# Patient Record
Sex: Female | Born: 1950 | Race: White | Hispanic: No | Marital: Married | Smoking: Never smoker
Health system: Southern US, Community
[De-identification: ages and names within clinical notes are randomized; demographics above are authoritative.]

## PROBLEM LIST (undated history)

## (undated) DIAGNOSIS — M8580 Other specified disorders of bone density and structure, unspecified site: Secondary | ICD-10-CM

## (undated) DIAGNOSIS — E7800 Pure hypercholesterolemia, unspecified: Secondary | ICD-10-CM

---

## 1999-02-23 ENCOUNTER — Other Ambulatory Visit: Admission: RE | Admit: 1999-02-23 | Discharge: 1999-02-23 | Payer: Self-pay | Admitting: Family Medicine

## 2000-05-05 ENCOUNTER — Encounter: Admission: RE | Admit: 2000-05-05 | Discharge: 2000-05-05 | Payer: Self-pay | Admitting: Family Medicine

## 2000-05-05 ENCOUNTER — Encounter: Payer: Self-pay | Admitting: Family Medicine

## 2000-05-09 ENCOUNTER — Encounter: Admission: RE | Admit: 2000-05-09 | Discharge: 2000-05-09 | Payer: Self-pay | Admitting: Family Medicine

## 2000-05-09 ENCOUNTER — Encounter: Payer: Self-pay | Admitting: Family Medicine

## 2000-12-19 ENCOUNTER — Encounter: Admission: RE | Admit: 2000-12-19 | Discharge: 2000-12-19 | Payer: Self-pay | Admitting: Family Medicine

## 2000-12-19 ENCOUNTER — Encounter: Payer: Self-pay | Admitting: Family Medicine

## 2001-06-27 ENCOUNTER — Other Ambulatory Visit: Admission: RE | Admit: 2001-06-27 | Discharge: 2001-06-27 | Payer: Self-pay | Admitting: Family Medicine

## 2003-10-23 ENCOUNTER — Encounter: Admission: RE | Admit: 2003-10-23 | Discharge: 2003-10-23 | Payer: Self-pay | Admitting: Family Medicine

## 2004-01-23 ENCOUNTER — Other Ambulatory Visit: Admission: RE | Admit: 2004-01-23 | Discharge: 2004-01-23 | Payer: Self-pay | Admitting: Family Medicine

## 2004-04-19 ENCOUNTER — Ambulatory Visit (HOSPITAL_COMMUNITY): Admission: RE | Admit: 2004-04-19 | Discharge: 2004-04-19 | Payer: Self-pay | Admitting: Gastroenterology

## 2004-04-19 ENCOUNTER — Encounter (INDEPENDENT_AMBULATORY_CARE_PROVIDER_SITE_OTHER): Payer: Self-pay | Admitting: Specialist

## 2005-06-20 HISTORY — PX: REDUCTION MAMMAPLASTY: SUR839

## 2005-08-10 ENCOUNTER — Encounter: Admission: RE | Admit: 2005-08-10 | Discharge: 2005-08-10 | Payer: Self-pay | Admitting: Family Medicine

## 2005-09-26 ENCOUNTER — Other Ambulatory Visit: Admission: RE | Admit: 2005-09-26 | Discharge: 2005-09-26 | Payer: Self-pay | Admitting: Family Medicine

## 2006-09-13 ENCOUNTER — Encounter: Admission: RE | Admit: 2006-09-13 | Discharge: 2006-09-13 | Payer: Self-pay | Admitting: Family Medicine

## 2006-09-27 ENCOUNTER — Other Ambulatory Visit: Admission: RE | Admit: 2006-09-27 | Discharge: 2006-09-27 | Payer: Self-pay | Admitting: Family Medicine

## 2007-10-15 ENCOUNTER — Other Ambulatory Visit: Admission: RE | Admit: 2007-10-15 | Discharge: 2007-10-15 | Payer: Self-pay | Admitting: Family Medicine

## 2008-01-02 ENCOUNTER — Encounter: Admission: RE | Admit: 2008-01-02 | Discharge: 2008-01-02 | Payer: Self-pay | Admitting: Family Medicine

## 2009-01-15 ENCOUNTER — Encounter: Admission: RE | Admit: 2009-01-15 | Discharge: 2009-01-15 | Payer: Self-pay | Admitting: Family Medicine

## 2009-03-24 ENCOUNTER — Other Ambulatory Visit: Admission: RE | Admit: 2009-03-24 | Discharge: 2009-03-24 | Payer: Self-pay | Admitting: Family Medicine

## 2010-02-16 ENCOUNTER — Encounter: Admission: RE | Admit: 2010-02-16 | Discharge: 2010-02-16 | Payer: Self-pay | Admitting: Family Medicine

## 2010-07-11 ENCOUNTER — Encounter: Payer: Self-pay | Admitting: Family Medicine

## 2010-11-05 NOTE — Op Note (Signed)
NAMEBURMA, KETCHER NO.:  [id number]   MEDICAL RECORD NO.:  [id number]          PATIENT TYPE:  AMB   LOCATION:  ENDO                         FACILITY:  [facility]   PHYSICIAN:  Danise Edge, M.D.   DATE OF BIRTH:  12-Apr-1951   DATE OF PROCEDURE:  04/19/2004  DATE OF DISCHARGE:                                 OPERATIVE REPORT   PROCEDURE:  Screening colonoscopy.   INDICATIONS FOR PROCEDURE:  Ms. Telisa Ohlsen is a 60 year old female born  1950-08-02.  Ms. Diana is scheduled to undergo her first screening  colonoscopy with polypectomy to prevent colon cancer.   ENDOSCOPIST:  Danise Edge, M.D.   PREMEDICATION:  Versed 8 mg, Demerol 50 mg.   DESCRIPTION OF PROCEDURE:  After obtaining informed consent, Ms. Fleishman was  placed in the left lateral decubitus position.  I administered intravenous  Demerol and intravenous Versed to achieve conscious sedation for the  procedure.  The patient's blood pressure, oxygen saturation, and cardiac  rhythm were monitored throughout the procedure and documented in the medical  record.   Anal inspection and digital rectal exam were normal.  The Olympus adjustable  pediatric colonoscope was introduced into the rectum and advanced to the  cecum.  The colonic preparation for the exam today was excellent.   Rectum:  Normal.   Sigmoid colon and descending colon:  At 40 cm from the anal verge, a 3-mm  sessile polyp was removed with electrocautery snare.   Splenic flexure:  Normal.   Transverse colon:  Normal.   Hepatic flexure:  Normal.   Ascending colon:  Normal.   Cecum and ileocecal valve:  In the proximal cecum adjacent to the  appendiceal orifice, a 2-mm sessile polyp was removed with the  electrocautery snare.  From the mid-cecum, a diminutive 1-mm sessile polyp  was removed with the electrocautery snare.   ASSESSMENT:  Two small polyps were removed from the cecum, and a small polyp  was removed from the distal  sigmoid colon.      MJ/MEDQ  D:  04/19/2004  T:  04/19/2004  Job:  [id number]   cc:   Donia Guiles, M.D.  [address] [place]  [place] [address]  Fax: [phone]

## 2011-04-13 ENCOUNTER — Other Ambulatory Visit: Payer: Self-pay | Admitting: Family Medicine

## 2011-04-13 DIAGNOSIS — Z1231 Encounter for screening mammogram for malignant neoplasm of breast: Secondary | ICD-10-CM

## 2011-05-04 ENCOUNTER — Ambulatory Visit
Admission: RE | Admit: 2011-05-04 | Discharge: 2011-05-04 | Disposition: A | Discharge status: Home / Self Care | Payer: BC Managed Care – PPO | Source: Ambulatory Visit | Attending: Family Medicine | Admitting: Family Medicine

## 2011-05-04 DIAGNOSIS — Z1231 Encounter for screening mammogram for malignant neoplasm of breast: Secondary | ICD-10-CM

## 2012-05-31 ENCOUNTER — Other Ambulatory Visit: Payer: Self-pay | Admitting: Advanced Practice Midwife

## 2012-05-31 DIAGNOSIS — Z9889 Other specified postprocedural states: Secondary | ICD-10-CM

## 2012-05-31 DIAGNOSIS — Z1231 Encounter for screening mammogram for malignant neoplasm of breast: Secondary | ICD-10-CM

## 2012-06-05 ENCOUNTER — Ambulatory Visit
Admission: RE | Admit: 2012-06-05 | Discharge: 2012-06-05 | Disposition: A | Discharge status: Home / Self Care | Payer: BC Managed Care – PPO | Source: Ambulatory Visit | Attending: Advanced Practice Midwife | Admitting: Advanced Practice Midwife

## 2012-06-05 DIAGNOSIS — Z9889 Other specified postprocedural states: Secondary | ICD-10-CM

## 2012-06-05 DIAGNOSIS — Z1231 Encounter for screening mammogram for malignant neoplasm of breast: Secondary | ICD-10-CM

## 2012-07-20 ENCOUNTER — Other Ambulatory Visit: Payer: Self-pay | Admitting: Family Medicine

## 2012-07-20 ENCOUNTER — Other Ambulatory Visit (HOSPITAL_COMMUNITY)
Admission: RE | Admit: 2012-07-20 | Discharge: 2012-07-20 | Disposition: A | Discharge status: Home / Self Care | Payer: BC Managed Care – PPO | Source: Ambulatory Visit | Attending: Family Medicine | Admitting: Family Medicine

## 2012-07-20 DIAGNOSIS — Z1151 Encounter for screening for human papillomavirus (HPV): Secondary | ICD-10-CM | POA: Insufficient documentation

## 2012-07-20 DIAGNOSIS — Z124 Encounter for screening for malignant neoplasm of cervix: Secondary | ICD-10-CM | POA: Insufficient documentation

## 2013-07-18 ENCOUNTER — Other Ambulatory Visit: Payer: Self-pay

## 2013-07-18 DIAGNOSIS — Z1231 Encounter for screening mammogram for malignant neoplasm of breast: Secondary | ICD-10-CM

## 2013-08-08 ENCOUNTER — Ambulatory Visit
Admission: RE | Admit: 2013-08-08 | Discharge: 2013-08-08 | Disposition: A | Discharge status: Home / Self Care | Payer: BC Managed Care – PPO | Source: Ambulatory Visit

## 2013-08-08 DIAGNOSIS — Z1231 Encounter for screening mammogram for malignant neoplasm of breast: Secondary | ICD-10-CM

## 2014-07-14 ENCOUNTER — Other Ambulatory Visit: Payer: Self-pay

## 2014-07-14 DIAGNOSIS — Z1231 Encounter for screening mammogram for malignant neoplasm of breast: Secondary | ICD-10-CM

## 2014-08-11 ENCOUNTER — Ambulatory Visit
Admission: RE | Admit: 2014-08-11 | Discharge: 2014-08-11 | Disposition: A | Discharge status: Home / Self Care | Payer: BLUE CROSS/BLUE SHIELD | Source: Ambulatory Visit

## 2014-08-11 DIAGNOSIS — Z1231 Encounter for screening mammogram for malignant neoplasm of breast: Secondary | ICD-10-CM

## 2014-10-02 ENCOUNTER — Other Ambulatory Visit: Payer: Self-pay | Admitting: Gastroenterology

## 2014-12-19 ENCOUNTER — Other Ambulatory Visit: Payer: Self-pay | Admitting: Gastroenterology

## 2015-01-02 ENCOUNTER — Encounter (HOSPITAL_COMMUNITY): Payer: Self-pay | Admitting: *Deleted

## 2015-01-05 ENCOUNTER — Other Ambulatory Visit: Payer: Self-pay | Admitting: Gastroenterology

## 2015-01-06 ENCOUNTER — Ambulatory Visit (HOSPITAL_COMMUNITY): Payer: BLUE CROSS/BLUE SHIELD | Admitting: Anesthesiology

## 2015-01-06 ENCOUNTER — Ambulatory Visit (HOSPITAL_COMMUNITY)
Admission: RE | Admit: 2015-01-06 | Discharge: 2015-01-06 | Disposition: A | Discharge status: Home / Self Care | Payer: BLUE CROSS/BLUE SHIELD | Source: Ambulatory Visit | Attending: Gastroenterology | Admitting: Gastroenterology

## 2015-01-06 ENCOUNTER — Encounter (HOSPITAL_COMMUNITY)
Admission: RE | Disposition: A | Discharge status: Home / Self Care | Payer: Self-pay | Source: Ambulatory Visit | Attending: Gastroenterology

## 2015-01-06 ENCOUNTER — Encounter (HOSPITAL_COMMUNITY): Payer: Self-pay | Admitting: *Deleted

## 2015-01-06 DIAGNOSIS — D120 Benign neoplasm of cecum: Secondary | ICD-10-CM | POA: Insufficient documentation

## 2015-01-06 DIAGNOSIS — Z86010 Personal history of colonic polyps: Secondary | ICD-10-CM | POA: Insufficient documentation

## 2015-01-06 DIAGNOSIS — D122 Benign neoplasm of ascending colon: Secondary | ICD-10-CM | POA: Insufficient documentation

## 2015-01-06 DIAGNOSIS — Z09 Encounter for follow-up examination after completed treatment for conditions other than malignant neoplasm: Secondary | ICD-10-CM | POA: Diagnosis present

## 2015-01-06 HISTORY — DX: Pure hypercholesterolemia, unspecified: E78.00

## 2015-01-06 HISTORY — PX: COLONOSCOPY WITH PROPOFOL: SHX5780

## 2015-01-06 HISTORY — DX: Other specified disorders of bone density and structure, unspecified site: M85.80

## 2015-01-06 SURGERY — COLONOSCOPY WITH PROPOFOL
Anesthesia: Monitor Anesthesia Care

## 2015-01-06 MED ORDER — PROPOFOL INFUSION 10 MG/ML OPTIME
INTRAVENOUS | Status: DC | PRN
  Administered 2015-01-06: 200 ug/kg/min via INTRAVENOUS

## 2015-01-06 MED ORDER — PROPOFOL 10 MG/ML IV BOLUS
INTRAVENOUS | Status: AC
  Filled 2015-01-06: qty 20

## 2015-01-06 MED ORDER — LACTATED RINGERS IV SOLN
INTRAVENOUS | Status: DC
  Administered 2015-01-06: 08:00:00 via INTRAVENOUS
  Administered 2015-01-06: 1000 mL via INTRAVENOUS

## 2015-01-06 MED ORDER — SODIUM CHLORIDE 0.9 % IV SOLN: INTRAVENOUS | Status: DC

## 2015-01-06 SURGICAL SUPPLY — 21 items

## 2015-01-06 NOTE — H&P (Signed)
  Procedure: Surveillance colonoscopy. 04/19/2004 colonoscopy performed with removal of a small tubulovillous adenomatous sigmoid colon polyp  History: The patient is a 64 year old female born 1951/04/09. She is scheduled to undergo a surveillance colonoscopy today.  Past medical history: Right lung surgery for dormant tuberculosis. Breast reduction surgery. Hypercholesterolemia.  Allergies: Bee sting causes anaphylaxis  Exam: The patient is alert and lying comfortably on the endoscopy stretcher. Abdomen is soft and nontender to palpation. Lungs are clear to auscultation. Cardiac exam reveals a regular rhythm.  Plan: Proceed with surveillance colonoscopy

## 2015-01-06 NOTE — Anesthesia Preprocedure Evaluation (Signed)
Anesthesia Evaluation  Patient identified by MRN, date of birth, ID band Patient awake    Reviewed: Allergy & Precautions, NPO status , Patient's Chart, lab work & pertinent test results  Airway Mallampati: I  TM Distance: >3 FB     Dental  (+) Teeth Intact   Pulmonary neg pulmonary ROS,  breath sounds clear to auscultation        Cardiovascular negative cardio ROS  Rhythm:Regular Rate:Normal     Neuro/Psych negative neurological ROS     GI/Hepatic negative GI ROS, Neg liver ROS,   Endo/Other  negative endocrine ROS  Renal/GU negative Renal ROS     Musculoskeletal negative musculoskeletal ROS (+)   Abdominal   Peds  Hematology negative hematology ROS (+)   Anesthesia Other Findings   Reproductive/Obstetrics                             Anesthesia Physical Anesthesia Plan  ASA: I  Anesthesia Plan: MAC   Post-op Pain Management:    Induction: Intravenous  Airway Management Planned: Natural Airway and Nasal Cannula  Additional Equipment:   Intra-op Plan:   Post-operative Plan:   Informed Consent: I have reviewed the patients History and Physical, chart, labs and discussed the procedure including the risks, benefits and alternatives for the proposed anesthesia with the patient or authorized representative who has indicated his/her understanding and acceptance.   Dental advisory given  Plan Discussed with: CRNA and Surgeon  Anesthesia Plan Comments:         Anesthesia Quick Evaluation

## 2015-01-06 NOTE — Discharge Instructions (Signed)
Colonoscopy, Care After °These instructions give you information on caring for yourself after your procedure. Your doctor may also give you more specific instructions. Call your doctor if you have any problems or questions after your procedure. °HOME CARE °· Do not drive for 24 hours. °· Do not sign important papers or use machinery for 24 hours. °· You may shower. °· You may go back to your usual activities, but go slower for the first 24 hours. °· Take rest breaks often during the first 24 hours. °· Walk around or use warm packs on your belly (abdomen) if you have belly cramping or gas. °· Drink enough fluids to keep your pee (urine) clear or pale yellow. °· Resume your normal diet. Avoid heavy or fried foods. °· Avoid drinking alcohol for 24 hours or as told by your doctor. °· Only take medicines as told by your doctor. °If a tissue sample (biopsy) was taken during the procedure:  °· Do not take aspirin or blood thinners for 7 days, or as told by your doctor. °· Do not drink alcohol for 7 days, or as told by your doctor. °· Eat soft foods for the first 24 hours. °GET HELP IF: °You still have a small amount of blood in your poop (stool) 2-3 days after the procedure. °GET HELP RIGHT AWAY IF: °· You have more than a small amount of blood in your poop. °· You see clumps of tissue (blood clots) in your poop. °· Your belly is puffy (swollen). °· You feel sick to your stomach (nauseous) or throw up (vomit). °· You have a fever. °· You have belly pain that gets worse and medicine does not help. °MAKE SURE YOU: °· Understand these instructions. °· Will watch your condition. °· Will get help right away if you are not doing well or get worse. °Document Released: 07/09/2010 Document Revised: 06/11/2013 Document Reviewed: 02/11/2013 °[employer]® Patient Information ©2015 [employer]. This information is not intended to replace advice given to you by your health care provider. Make sure you discuss any questions you have with  your health care provider. ° °

## 2015-01-06 NOTE — Op Note (Signed)
Procedure: Surveillance colonoscopy. 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 Anal inspection and digital rectal exam were normal. The Pentax pediatric colonoscope was introduced into the rectum and advanced to the cecum. A normal-appearing appendiceal orifice and ileocecal valve were identified. Colonic preparation for the exam today was good. Withdrawal time was 10 minutes  Rectum. Normal. Retroflex view of the distal rectum was normal  Sigmoid colon and descending colon. Normal  Splenic flexure. Normal  Transverse colon. Normal  Hepatic flexure. Normal  Ascending colon. From the proximal ascending colon, a 7 mm sessile serrated appearing polyp was removed in piecemeal fashion with the cold snare and cold biopsy forceps.  Cecum and ileocecal valve. A 5 mm sessile polyp was removed from the mid cecum with the cold snare.  Assessment: From the proximal ascending colon, a 7 mm sessile serrated appearing polyp was removed with the cold snare and from the cecum, a 5 mm sessile polyp was removed with the cold snare.  Recommendation: I will review the polyp pathology to determine when a repeat colonoscopy should be performed

## 2015-01-06 NOTE — Anesthesia Postprocedure Evaluation (Signed)
  Anesthesia Post-op Note  Patient: Jean Andrews  Procedure(s) Performed: Procedure(s): COLONOSCOPY WITH PROPOFOL (N/A)  Patient Location: Endoscopy Unit  Anesthesia Type:MAC  Level of Consciousness: awake and alert   Airway and Oxygen Therapy: Patient Spontanous Breathing  Post-op Pain: none  Post-op Assessment: Post-op Vital signs reviewed              Post-op Vital Signs: stable  Last Vitals:  Filed Vitals:   01/06/15 0950  BP:   Pulse: 52  Temp:   Resp:     Complications: No apparent anesthesia complications

## 2015-01-06 NOTE — Transfer of Care (Signed)
Immediate Anesthesia Transfer of Care Note  Patient: Jean Andrews  Procedure(s) Performed: Procedure(s): COLONOSCOPY WITH PROPOFOL (N/A)  Patient Location: PACU  Anesthesia Type:MAC  Level of Consciousness:  sedated, patient cooperative and responds to stimulation  Airway & Oxygen Therapy:Patient Spontanous Breathing and Patient connected to face mask oxgen  Post-op Assessment:  Report given to PACU RN and Post -op Vital signs reviewed and stable  Post vital signs:  Reviewed and stable  Last Vitals:  Filed Vitals:   01/06/15 0802  BP: 128/74  Temp:   Resp: 10    Complications: No apparent anesthesia complications

## 2015-01-06 NOTE — Progress Notes (Signed)
Patient reports that her pulse is usually lower after she has procedures. Pulse 47-52. Patient is asymptomatic and BP and all other vita signs are normal. Patient is not dizzy, lightheaded or in any pain. Advised patient to rest today and rehydrate. Patient verbalized understanding.

## 2015-01-08 ENCOUNTER — Encounter (HOSPITAL_COMMUNITY): Payer: Self-pay | Admitting: Gastroenterology

## 2015-08-24 ENCOUNTER — Other Ambulatory Visit: Payer: Self-pay

## 2015-08-24 DIAGNOSIS — Z1231 Encounter for screening mammogram for malignant neoplasm of breast: Secondary | ICD-10-CM

## 2015-08-24 DIAGNOSIS — Z9889 Other specified postprocedural states: Secondary | ICD-10-CM

## 2015-09-07 ENCOUNTER — Ambulatory Visit: Payer: BLUE CROSS/BLUE SHIELD

## 2015-09-09 ENCOUNTER — Ambulatory Visit
Admission: RE | Admit: 2015-09-09 | Discharge: 2015-09-09 | Disposition: A | Discharge status: Home / Self Care | Payer: BLUE CROSS/BLUE SHIELD | Source: Ambulatory Visit

## 2015-09-09 DIAGNOSIS — Z9889 Other specified postprocedural states: Secondary | ICD-10-CM

## 2015-09-09 DIAGNOSIS — Z1231 Encounter for screening mammogram for malignant neoplasm of breast: Secondary | ICD-10-CM

## 2016-10-26 ENCOUNTER — Other Ambulatory Visit: Payer: Self-pay | Admitting: Family Medicine

## 2016-10-26 DIAGNOSIS — Z1231 Encounter for screening mammogram for malignant neoplasm of breast: Secondary | ICD-10-CM

## 2016-11-15 ENCOUNTER — Ambulatory Visit: Payer: BLUE CROSS/BLUE SHIELD

## 2016-11-24 ENCOUNTER — Ambulatory Visit
Admission: RE | Admit: 2016-11-24 | Discharge: 2016-11-24 | Disposition: A | Discharge status: Home / Self Care | Payer: BLUE CROSS/BLUE SHIELD | Source: Ambulatory Visit | Attending: Family Medicine | Admitting: Family Medicine

## 2016-11-24 DIAGNOSIS — Z1231 Encounter for screening mammogram for malignant neoplasm of breast: Secondary | ICD-10-CM

## 2018-01-15 DIAGNOSIS — H04123 Dry eye syndrome of bilateral lacrimal glands: Secondary | ICD-10-CM | POA: Diagnosis not present

## 2018-01-31 DIAGNOSIS — M8588 Other specified disorders of bone density and structure, other site: Secondary | ICD-10-CM | POA: Diagnosis not present

## 2019-05-29 ENCOUNTER — Other Ambulatory Visit (HOSPITAL_COMMUNITY)
Admission: RE | Admit: 2019-05-29 | Discharge: 2019-05-29 | Disposition: A | Discharge status: Home / Self Care | Payer: PPO | Source: Ambulatory Visit | Attending: Family Medicine | Admitting: Family Medicine

## 2019-05-29 ENCOUNTER — Other Ambulatory Visit: Payer: Self-pay | Admitting: Family Medicine

## 2019-05-29 DIAGNOSIS — R636 Underweight: Secondary | ICD-10-CM | POA: Diagnosis not present

## 2019-05-29 DIAGNOSIS — Z1151 Encounter for screening for human papillomavirus (HPV): Secondary | ICD-10-CM | POA: Diagnosis not present

## 2019-05-29 DIAGNOSIS — M8580 Other specified disorders of bone density and structure, unspecified site: Secondary | ICD-10-CM | POA: Diagnosis not present

## 2019-05-29 DIAGNOSIS — Z124 Encounter for screening for malignant neoplasm of cervix: Secondary | ICD-10-CM | POA: Diagnosis not present

## 2019-05-29 DIAGNOSIS — Z23 Encounter for immunization: Secondary | ICD-10-CM | POA: Diagnosis not present

## 2019-05-29 DIAGNOSIS — Z0000 Encounter for general adult medical examination without abnormal findings: Secondary | ICD-10-CM | POA: Diagnosis not present

## 2019-05-29 DIAGNOSIS — Z86010 Personal history of colonic polyps: Secondary | ICD-10-CM | POA: Diagnosis not present

## 2019-05-29 DIAGNOSIS — E782 Mixed hyperlipidemia: Secondary | ICD-10-CM | POA: Diagnosis not present

## 2019-05-31 ENCOUNTER — Other Ambulatory Visit: Payer: Self-pay | Admitting: Family Medicine

## 2019-05-31 DIAGNOSIS — Z1231 Encounter for screening mammogram for malignant neoplasm of breast: Secondary | ICD-10-CM

## 2019-05-31 LAB — CYTOLOGY - PAP
Comment: NEGATIVE
Diagnosis: NEGATIVE
High risk HPV: NEGATIVE

## 2019-06-06 ENCOUNTER — Other Ambulatory Visit: Payer: Self-pay

## 2019-06-06 ENCOUNTER — Ambulatory Visit
Admission: RE | Admit: 2019-06-06 | Discharge: 2019-06-06 | Disposition: A | Discharge status: Home / Self Care | Payer: PPO | Source: Ambulatory Visit | Attending: Family Medicine | Admitting: Family Medicine

## 2019-06-06 DIAGNOSIS — Z1231 Encounter for screening mammogram for malignant neoplasm of breast: Secondary | ICD-10-CM

## 2019-07-21 ENCOUNTER — Ambulatory Visit: Payer: BLUE CROSS/BLUE SHIELD

## 2019-07-26 ENCOUNTER — Ambulatory Visit: Payer: BLUE CROSS/BLUE SHIELD

## 2019-07-28 ENCOUNTER — Ambulatory Visit: Payer: BLUE CROSS/BLUE SHIELD

## 2020-02-19 DIAGNOSIS — H2513 Age-related nuclear cataract, bilateral: Secondary | ICD-10-CM | POA: Diagnosis not present

## 2020-02-19 DIAGNOSIS — H5203 Hypermetropia, bilateral: Secondary | ICD-10-CM | POA: Diagnosis not present

## 2020-06-03 DIAGNOSIS — Z86010 Personal history of colonic polyps: Secondary | ICD-10-CM | POA: Diagnosis not present

## 2020-06-03 DIAGNOSIS — E782 Mixed hyperlipidemia: Secondary | ICD-10-CM | POA: Diagnosis not present

## 2020-06-03 DIAGNOSIS — M8580 Other specified disorders of bone density and structure, unspecified site: Secondary | ICD-10-CM | POA: Diagnosis not present

## 2020-06-03 DIAGNOSIS — Z0000 Encounter for general adult medical examination without abnormal findings: Secondary | ICD-10-CM | POA: Diagnosis not present

## 2020-06-16 ENCOUNTER — Other Ambulatory Visit: Payer: Self-pay | Admitting: Family Medicine

## 2020-06-16 DIAGNOSIS — M8589 Other specified disorders of bone density and structure, multiple sites: Secondary | ICD-10-CM

## 2020-06-17 ENCOUNTER — Other Ambulatory Visit: Payer: Self-pay | Admitting: Family Medicine

## 2020-06-17 DIAGNOSIS — Z1231 Encounter for screening mammogram for malignant neoplasm of breast: Secondary | ICD-10-CM

## 2020-09-25 IMAGING — MG DIGITAL SCREENING BILAT W/ TOMO W/ CAD
8 series · 9 of 24 positions shown · non-contrast
Comparison: Previous exam(s).

CLINICAL DATA: Screening.

EXAM:
DIGITAL SCREENING BILATERAL MAMMOGRAM WITH TOMO AND CAD

[R MLO synth-2D]
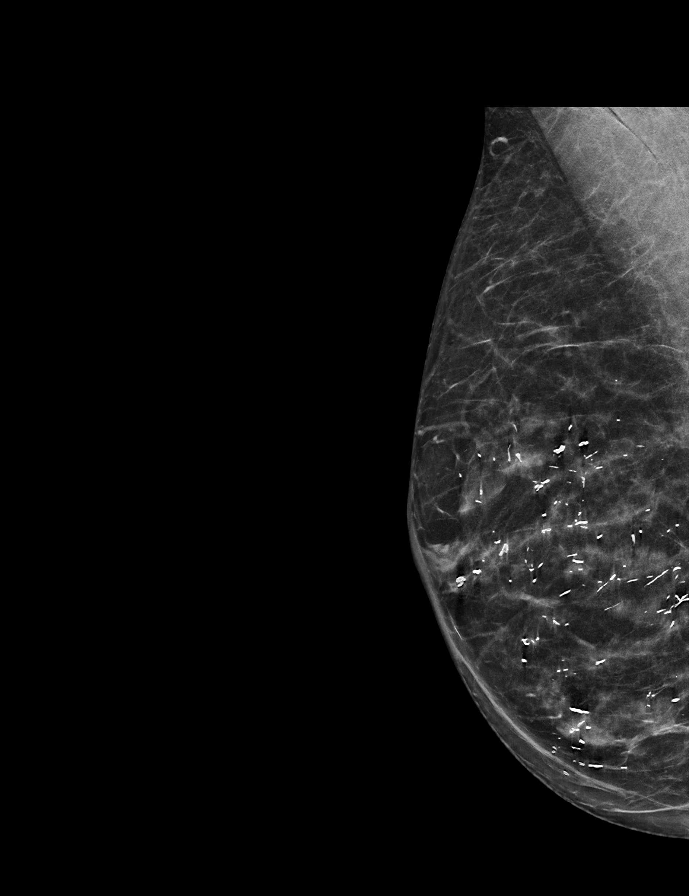

[L MLO synth-2D]
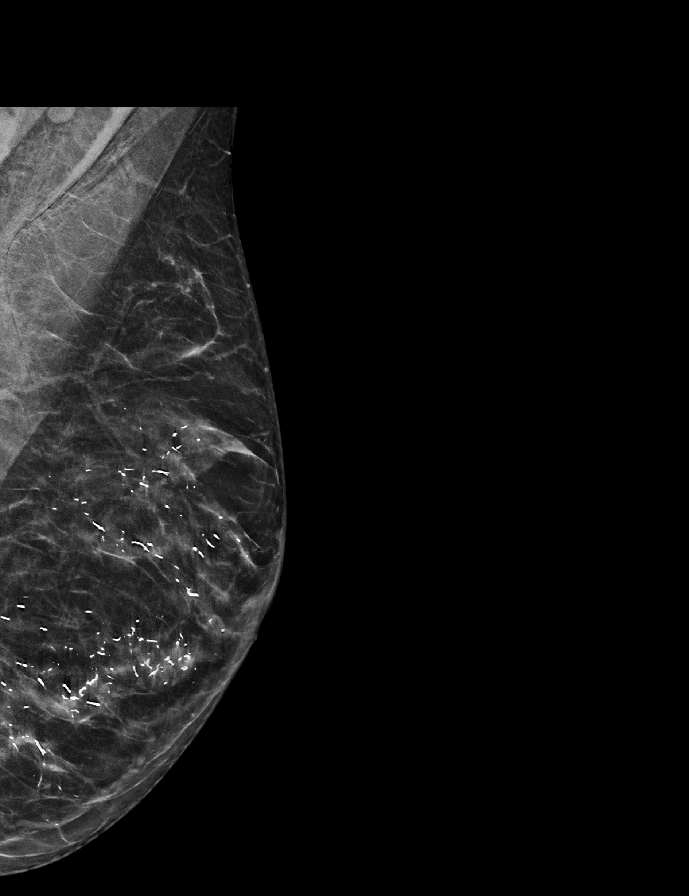

[L CC synth-2D]
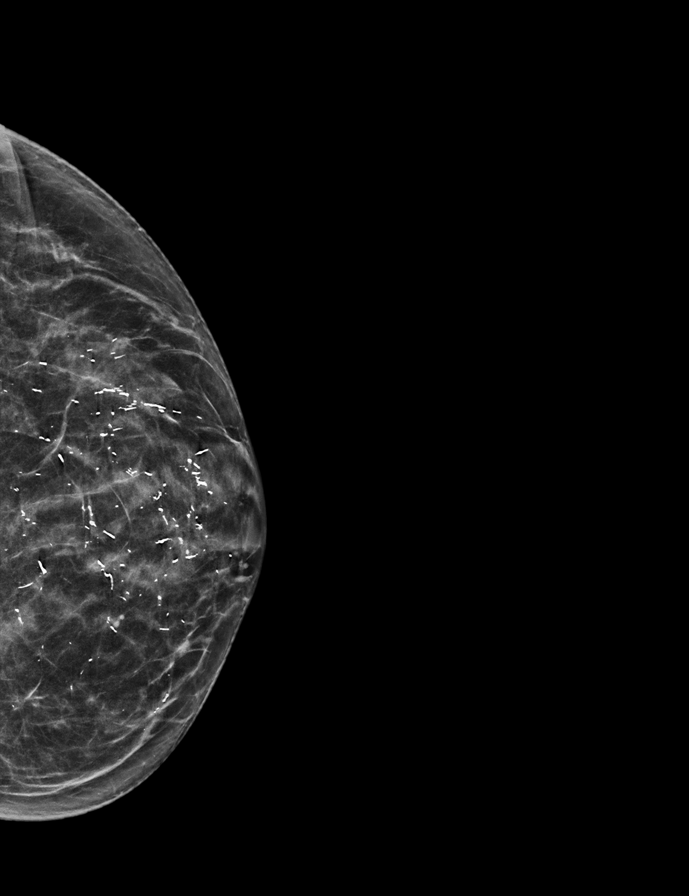

[R CC synth-2D]
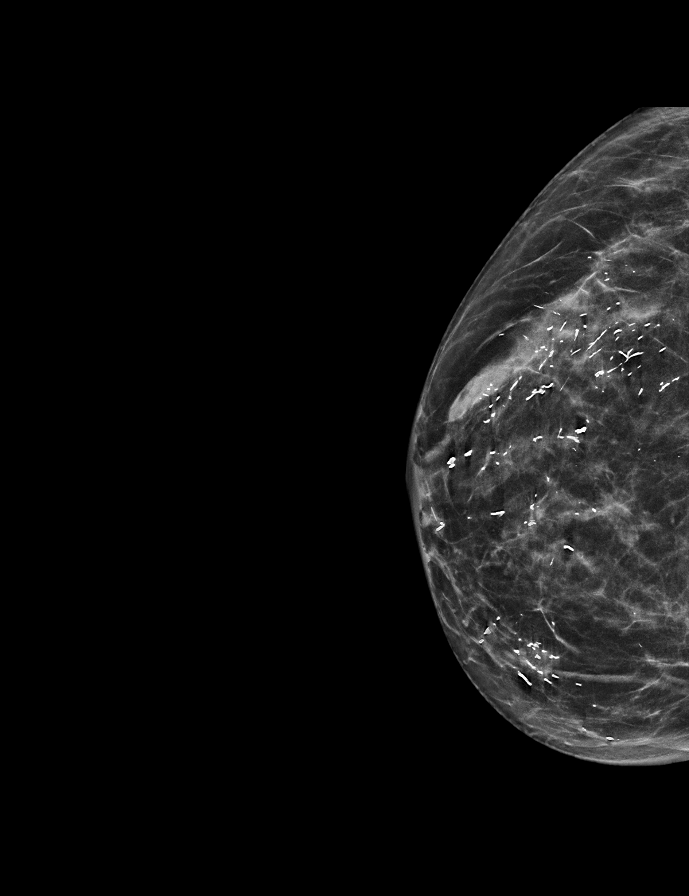

[R CC tomo · 2 of 53 frames shown]
[frame 18/53]
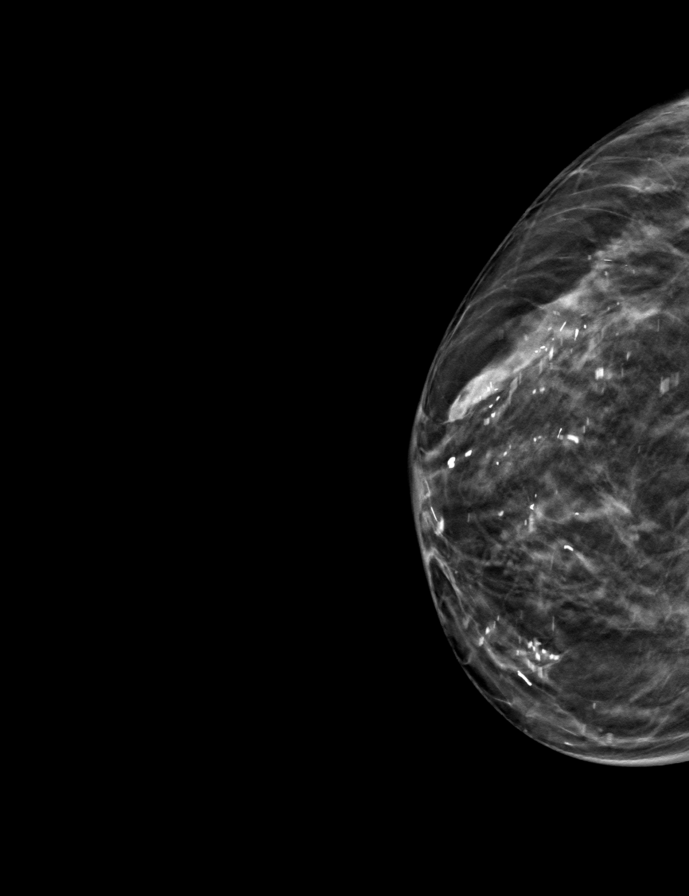
[frame 27/53]
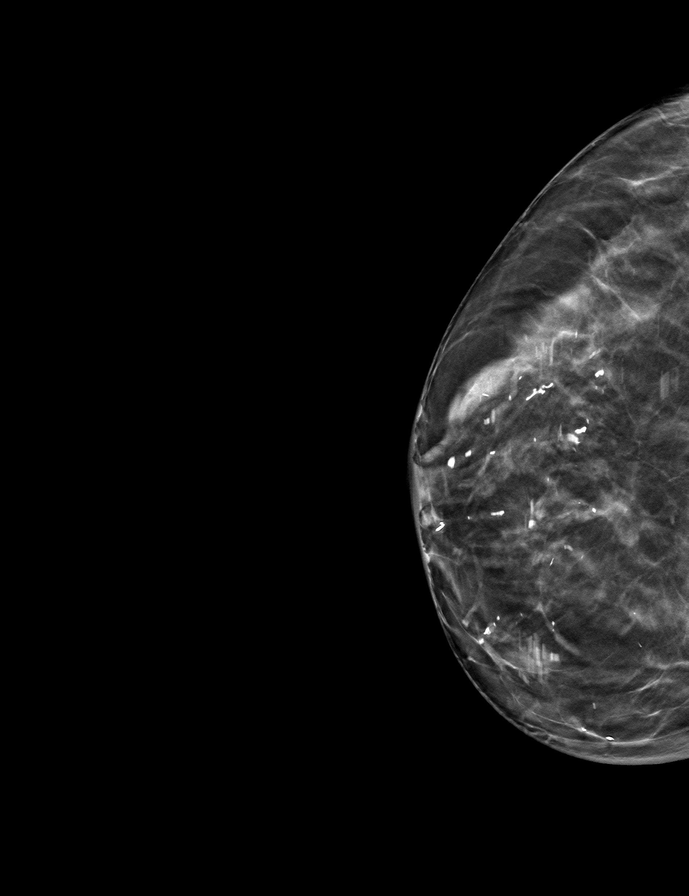

[L CC tomo · tomo slice 27/52.0]
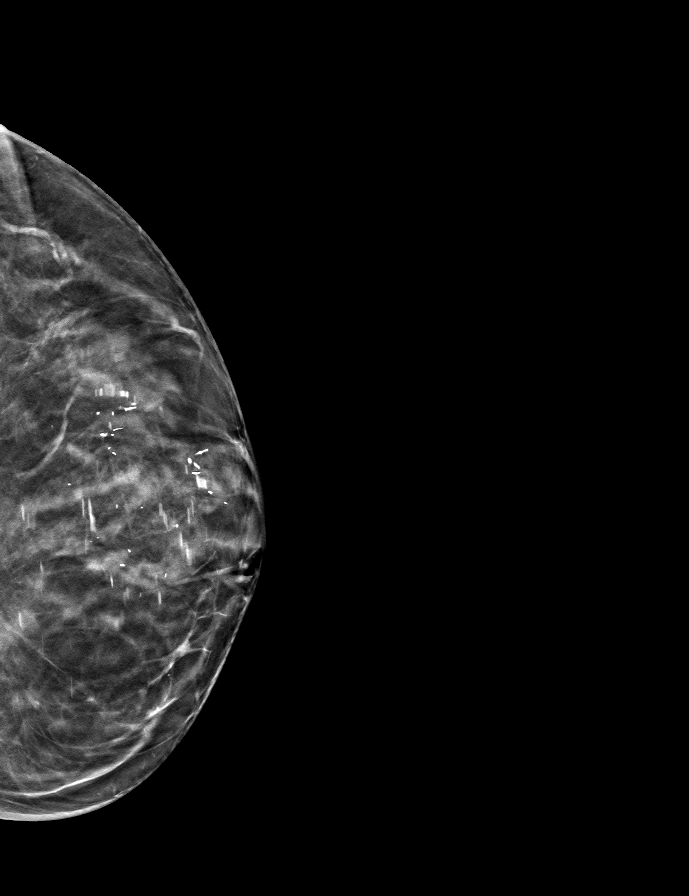

[L MLO tomo · tomo slice 27/53.0]
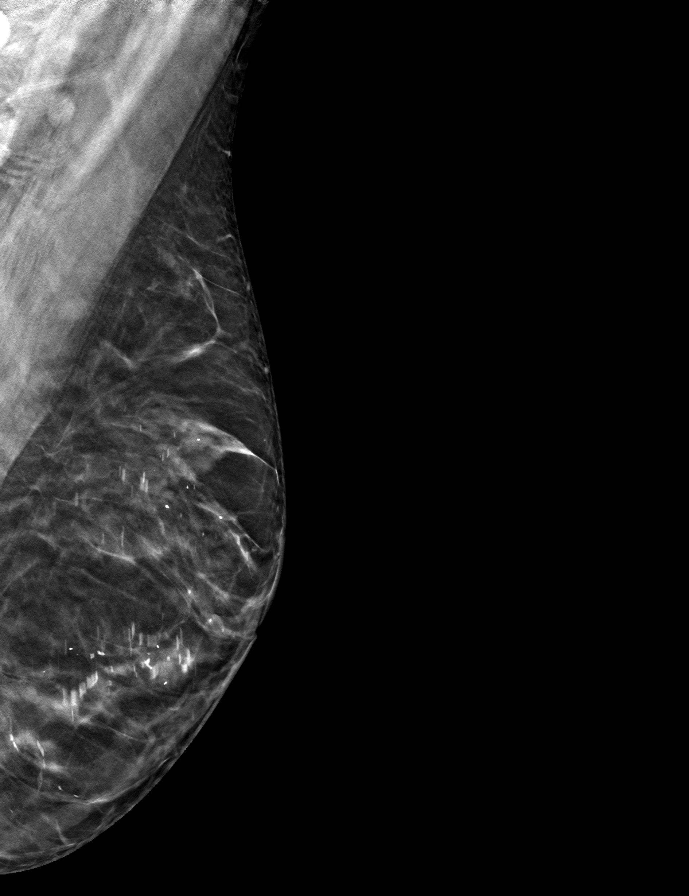

[R MLO tomo · tomo slice 27/52.0]
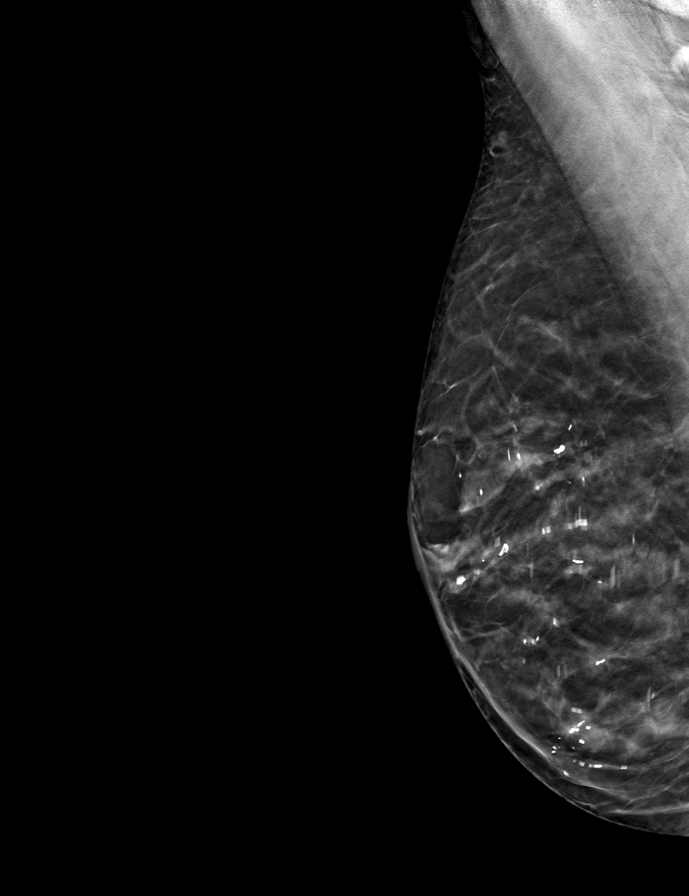

[9 of 24 positions shown; findings below may reference images not displayed]

ACR Breast Density Category b: There are scattered areas of
fibroglandular density.
FINDINGS: There are no findings suspicious for malignancy. Images were
processed with CAD.
IMPRESSION: No mammographic evidence of malignancy. A result letter of this
screening mammogram will be mailed directly to the patient.

RECOMMENDATION:
Screening mammogram in one year. (Code:CN-[id number])

BI-RADS CATEGORY  1: Negative.

## 2020-10-02 ENCOUNTER — Other Ambulatory Visit: Payer: Self-pay

## 2020-10-02 ENCOUNTER — Ambulatory Visit
Admission: RE | Admit: 2020-10-02 | Discharge: 2020-10-02 | Disposition: A | Discharge status: Home / Self Care | Payer: PPO | Source: Ambulatory Visit | Attending: Family Medicine | Admitting: Family Medicine

## 2020-10-02 ENCOUNTER — Other Ambulatory Visit: Payer: BLUE CROSS/BLUE SHIELD

## 2020-10-02 DIAGNOSIS — Z1231 Encounter for screening mammogram for malignant neoplasm of breast: Secondary | ICD-10-CM | POA: Diagnosis not present

## 2020-11-25 DIAGNOSIS — Z86010 Personal history of colonic polyps: Secondary | ICD-10-CM | POA: Diagnosis not present

## 2020-11-25 DIAGNOSIS — K5730 Diverticulosis of large intestine without perforation or abscess without bleeding: Secondary | ICD-10-CM | POA: Diagnosis not present

## 2021-01-26 DIAGNOSIS — Z20822 Contact with and (suspected) exposure to covid-19: Secondary | ICD-10-CM | POA: Diagnosis not present

## 2021-02-02 DIAGNOSIS — Z20822 Contact with and (suspected) exposure to covid-19: Secondary | ICD-10-CM | POA: Diagnosis not present

## 2021-02-19 DIAGNOSIS — H524 Presbyopia: Secondary | ICD-10-CM | POA: Diagnosis not present

## 2021-02-19 DIAGNOSIS — H5203 Hypermetropia, bilateral: Secondary | ICD-10-CM | POA: Diagnosis not present

## 2021-02-19 DIAGNOSIS — H2513 Age-related nuclear cataract, bilateral: Secondary | ICD-10-CM | POA: Diagnosis not present

## 2021-03-15 ENCOUNTER — Ambulatory Visit
Admission: RE | Admit: 2021-03-15 | Discharge: 2021-03-15 | Disposition: A | Discharge status: Home / Self Care | Payer: PPO | Source: Ambulatory Visit | Attending: Family Medicine | Admitting: Family Medicine

## 2021-03-15 ENCOUNTER — Other Ambulatory Visit: Payer: Self-pay

## 2021-03-15 DIAGNOSIS — Z780 Asymptomatic menopausal state: Secondary | ICD-10-CM | POA: Diagnosis not present

## 2021-03-15 DIAGNOSIS — M8589 Other specified disorders of bone density and structure, multiple sites: Secondary | ICD-10-CM

## 2021-03-15 DIAGNOSIS — M8588 Other specified disorders of bone density and structure, other site: Secondary | ICD-10-CM | POA: Diagnosis not present

## 2021-03-15 DIAGNOSIS — M810 Age-related osteoporosis without current pathological fracture: Secondary | ICD-10-CM | POA: Diagnosis not present

## 2021-06-28 DIAGNOSIS — E782 Mixed hyperlipidemia: Secondary | ICD-10-CM | POA: Diagnosis not present

## 2021-06-28 DIAGNOSIS — Z0000 Encounter for general adult medical examination without abnormal findings: Secondary | ICD-10-CM | POA: Diagnosis not present

## 2021-06-28 DIAGNOSIS — Z23 Encounter for immunization: Secondary | ICD-10-CM | POA: Diagnosis not present

## 2021-06-28 DIAGNOSIS — M8580 Other specified disorders of bone density and structure, unspecified site: Secondary | ICD-10-CM | POA: Diagnosis not present

## 2021-06-28 DIAGNOSIS — Z86010 Personal history of colonic polyps: Secondary | ICD-10-CM | POA: Diagnosis not present

## 2021-06-28 DIAGNOSIS — R636 Underweight: Secondary | ICD-10-CM | POA: Diagnosis not present

## 2021-08-24 DIAGNOSIS — H9313 Tinnitus, bilateral: Secondary | ICD-10-CM | POA: Diagnosis not present

## 2021-08-24 DIAGNOSIS — H903 Sensorineural hearing loss, bilateral: Secondary | ICD-10-CM | POA: Diagnosis not present

## 2021-08-24 DIAGNOSIS — Z77122 Contact with and (suspected) exposure to noise: Secondary | ICD-10-CM | POA: Diagnosis not present

## 2021-10-05 DIAGNOSIS — D225 Melanocytic nevi of trunk: Secondary | ICD-10-CM | POA: Diagnosis not present

## 2021-10-05 DIAGNOSIS — C44311 Basal cell carcinoma of skin of nose: Secondary | ICD-10-CM | POA: Diagnosis not present

## 2021-10-05 DIAGNOSIS — D485 Neoplasm of uncertain behavior of skin: Secondary | ICD-10-CM | POA: Diagnosis not present

## 2021-10-05 DIAGNOSIS — D2272 Melanocytic nevi of left lower limb, including hip: Secondary | ICD-10-CM | POA: Diagnosis not present

## 2021-10-05 DIAGNOSIS — L821 Other seborrheic keratosis: Secondary | ICD-10-CM | POA: Diagnosis not present

## 2021-10-26 DIAGNOSIS — L97929 Non-pressure chronic ulcer of unspecified part of left lower leg with unspecified severity: Secondary | ICD-10-CM | POA: Diagnosis not present

## 2021-10-26 DIAGNOSIS — D485 Neoplasm of uncertain behavior of skin: Secondary | ICD-10-CM | POA: Diagnosis not present

## 2021-11-03 ENCOUNTER — Other Ambulatory Visit: Payer: Self-pay | Admitting: Family Medicine

## 2021-11-03 ENCOUNTER — Ambulatory Visit
Admission: RE | Admit: 2021-11-03 | Discharge: 2021-11-03 | Disposition: A | Discharge status: Home / Self Care | Payer: PPO | Source: Ambulatory Visit | Attending: Family Medicine | Admitting: Family Medicine

## 2021-11-03 DIAGNOSIS — Z1231 Encounter for screening mammogram for malignant neoplasm of breast: Secondary | ICD-10-CM | POA: Diagnosis not present

## 2022-02-23 DIAGNOSIS — H5203 Hypermetropia, bilateral: Secondary | ICD-10-CM | POA: Diagnosis not present

## 2022-02-23 DIAGNOSIS — H524 Presbyopia: Secondary | ICD-10-CM | POA: Diagnosis not present

## 2022-02-23 DIAGNOSIS — H2513 Age-related nuclear cataract, bilateral: Secondary | ICD-10-CM | POA: Diagnosis not present

## 2022-03-01 DIAGNOSIS — D483 Neoplasm of uncertain behavior of retroperitoneum: Secondary | ICD-10-CM | POA: Diagnosis not present

## 2022-03-01 DIAGNOSIS — Z85828 Personal history of other malignant neoplasm of skin: Secondary | ICD-10-CM | POA: Diagnosis not present

## 2022-03-01 DIAGNOSIS — Z08 Encounter for follow-up examination after completed treatment for malignant neoplasm: Secondary | ICD-10-CM | POA: Diagnosis not present

## 2022-07-06 DIAGNOSIS — M8580 Other specified disorders of bone density and structure, unspecified site: Secondary | ICD-10-CM | POA: Diagnosis not present

## 2022-07-06 DIAGNOSIS — E782 Mixed hyperlipidemia: Secondary | ICD-10-CM | POA: Diagnosis not present

## 2022-07-06 DIAGNOSIS — Z0000 Encounter for general adult medical examination without abnormal findings: Secondary | ICD-10-CM | POA: Diagnosis not present

## 2022-07-06 DIAGNOSIS — Z86010 Personal history of colonic polyps: Secondary | ICD-10-CM | POA: Diagnosis not present

## 2022-08-30 DIAGNOSIS — Z1283 Encounter for screening for malignant neoplasm of skin: Secondary | ICD-10-CM | POA: Diagnosis not present

## 2022-08-30 DIAGNOSIS — D225 Melanocytic nevi of trunk: Secondary | ICD-10-CM | POA: Diagnosis not present

## 2022-12-07 ENCOUNTER — Other Ambulatory Visit: Payer: Self-pay | Admitting: Family Medicine

## 2022-12-07 DIAGNOSIS — Z1231 Encounter for screening mammogram for malignant neoplasm of breast: Secondary | ICD-10-CM

## 2022-12-08 ENCOUNTER — Ambulatory Visit
Admission: RE | Admit: 2022-12-08 | Discharge: 2022-12-08 | Disposition: A | Discharge status: Home / Self Care | Payer: PPO | Source: Ambulatory Visit | Attending: Family Medicine | Admitting: Family Medicine

## 2022-12-08 DIAGNOSIS — Z1231 Encounter for screening mammogram for malignant neoplasm of breast: Secondary | ICD-10-CM

## 2023-02-28 DIAGNOSIS — H2513 Age-related nuclear cataract, bilateral: Secondary | ICD-10-CM | POA: Diagnosis not present

## 2023-02-28 DIAGNOSIS — H5203 Hypermetropia, bilateral: Secondary | ICD-10-CM | POA: Diagnosis not present

## 2023-05-08 DIAGNOSIS — Z23 Encounter for immunization: Secondary | ICD-10-CM | POA: Diagnosis not present

## 2023-07-12 ENCOUNTER — Other Ambulatory Visit: Payer: Self-pay | Admitting: Family Medicine

## 2023-07-12 DIAGNOSIS — M8589 Other specified disorders of bone density and structure, multiple sites: Secondary | ICD-10-CM

## 2023-07-25 ENCOUNTER — Ambulatory Visit
Admission: RE | Admit: 2023-07-25 | Discharge: 2023-07-25 | Disposition: A | Discharge status: Home / Self Care | Payer: PPO | Source: Ambulatory Visit | Attending: Family Medicine | Admitting: Family Medicine

## 2023-07-25 DIAGNOSIS — M8589 Other specified disorders of bone density and structure, multiple sites: Secondary | ICD-10-CM

## 2023-08-30 DIAGNOSIS — X32XXXD Exposure to sunlight, subsequent encounter: Secondary | ICD-10-CM | POA: Diagnosis not present

## 2023-08-30 DIAGNOSIS — Z1283 Encounter for screening for malignant neoplasm of skin: Secondary | ICD-10-CM | POA: Diagnosis not present

## 2023-08-30 DIAGNOSIS — D225 Melanocytic nevi of trunk: Secondary | ICD-10-CM | POA: Diagnosis not present

## 2023-08-30 DIAGNOSIS — L570 Actinic keratosis: Secondary | ICD-10-CM | POA: Diagnosis not present

## 2023-09-18 DIAGNOSIS — L570 Actinic keratosis: Secondary | ICD-10-CM | POA: Diagnosis not present

## 2023-09-18 DIAGNOSIS — X32XXXD Exposure to sunlight, subsequent encounter: Secondary | ICD-10-CM | POA: Diagnosis not present

## 2023-10-30 DIAGNOSIS — H10412 Chronic giant papillary conjunctivitis, left eye: Secondary | ICD-10-CM | POA: Diagnosis not present

## 2023-11-03 ENCOUNTER — Other Ambulatory Visit: Payer: Self-pay | Admitting: Family Medicine

## 2023-11-03 DIAGNOSIS — Z1231 Encounter for screening mammogram for malignant neoplasm of breast: Secondary | ICD-10-CM

## 2023-11-18 DIAGNOSIS — E782 Mixed hyperlipidemia: Secondary | ICD-10-CM | POA: Diagnosis not present

## 2023-12-12 ENCOUNTER — Ambulatory Visit

## 2023-12-18 DIAGNOSIS — E782 Mixed hyperlipidemia: Secondary | ICD-10-CM | POA: Diagnosis not present

## 2023-12-21 ENCOUNTER — Ambulatory Visit

## 2023-12-21 ENCOUNTER — Ambulatory Visit
Admission: RE | Admit: 2023-12-21 | Discharge: 2023-12-21 | Disposition: A | Discharge status: Home / Self Care | Source: Ambulatory Visit | Attending: Family Medicine | Admitting: Family Medicine

## 2023-12-21 DIAGNOSIS — Z1231 Encounter for screening mammogram for malignant neoplasm of breast: Secondary | ICD-10-CM

## 2024-01-18 DIAGNOSIS — E782 Mixed hyperlipidemia: Secondary | ICD-10-CM | POA: Diagnosis not present

## 2024-01-24 DIAGNOSIS — U071 COVID-19: Secondary | ICD-10-CM | POA: Diagnosis not present

## 2024-01-24 DIAGNOSIS — R5383 Other fatigue: Secondary | ICD-10-CM | POA: Diagnosis not present

## 2024-01-24 DIAGNOSIS — R051 Acute cough: Secondary | ICD-10-CM | POA: Diagnosis not present

## 2024-02-18 DIAGNOSIS — E782 Mixed hyperlipidemia: Secondary | ICD-10-CM | POA: Diagnosis not present

## 2024-02-29 DIAGNOSIS — H5203 Hypermetropia, bilateral: Secondary | ICD-10-CM | POA: Diagnosis not present

## 2024-02-29 DIAGNOSIS — H2513 Age-related nuclear cataract, bilateral: Secondary | ICD-10-CM | POA: Diagnosis not present

## 2024-03-18 DIAGNOSIS — X32XXXD Exposure to sunlight, subsequent encounter: Secondary | ICD-10-CM | POA: Diagnosis not present

## 2024-03-18 DIAGNOSIS — L570 Actinic keratosis: Secondary | ICD-10-CM | POA: Diagnosis not present

## 2024-03-18 DIAGNOSIS — L820 Inflamed seborrheic keratosis: Secondary | ICD-10-CM | POA: Diagnosis not present

## 2024-03-19 DIAGNOSIS — E782 Mixed hyperlipidemia: Secondary | ICD-10-CM | POA: Diagnosis not present

## 2024-04-19 DIAGNOSIS — E782 Mixed hyperlipidemia: Secondary | ICD-10-CM | POA: Diagnosis not present
